# Patient Record
Sex: Male | Born: 1955 | Race: White | Hispanic: No | Smoking: Current every day smoker
Health system: Southern US, Community
[De-identification: ages and names within clinical notes are randomized; demographics above are authoritative.]

## PROBLEM LIST (undated history)

## (undated) DIAGNOSIS — E039 Hypothyroidism, unspecified: Secondary | ICD-10-CM

## (undated) HISTORY — PX: MASS BIOPSY: SHX5445

---

## 1998-10-20 HISTORY — PX: TIBIA FRACTURE SURGERY: SHX806

## 2017-01-28 ENCOUNTER — Other Ambulatory Visit: Payer: Self-pay | Admitting: Family Medicine

## 2017-01-28 DIAGNOSIS — R221 Localized swelling, mass and lump, neck: Secondary | ICD-10-CM

## 2017-02-04 ENCOUNTER — Other Ambulatory Visit: Payer: Self-pay

## 2017-02-06 ENCOUNTER — Ambulatory Visit
Admission: RE | Admit: 2017-02-06 | Discharge: 2017-02-06 | Disposition: A | Discharge status: Home / Self Care | Payer: Self-pay | Source: Ambulatory Visit | Attending: Family Medicine | Admitting: Family Medicine

## 2017-02-06 DIAGNOSIS — R221 Localized swelling, mass and lump, neck: Secondary | ICD-10-CM

## 2017-02-20 ENCOUNTER — Other Ambulatory Visit: Payer: Self-pay | Admitting: Family Medicine

## 2017-02-20 DIAGNOSIS — R221 Localized swelling, mass and lump, neck: Secondary | ICD-10-CM

## 2017-02-27 ENCOUNTER — Ambulatory Visit
Admission: RE | Admit: 2017-02-27 | Discharge: 2017-02-27 | Disposition: A | Discharge status: Home / Self Care | Payer: BLUE CROSS/BLUE SHIELD | Source: Ambulatory Visit | Attending: Family Medicine | Admitting: Family Medicine

## 2017-02-27 DIAGNOSIS — R221 Localized swelling, mass and lump, neck: Secondary | ICD-10-CM

## 2017-02-27 MED ORDER — IOPAMIDOL (ISOVUE-300) INJECTION 61%
75.0000 mL | Freq: Once | INTRAVENOUS | Status: AC | PRN
Start: 2017-02-27 — End: 2017-02-27
  Administered 2017-02-27: 75 mL via INTRAVENOUS

## 2017-03-19 ENCOUNTER — Other Ambulatory Visit (HOSPITAL_COMMUNITY): Payer: Self-pay | Admitting: Otolaryngology

## 2017-03-19 DIAGNOSIS — C770 Secondary and unspecified malignant neoplasm of lymph nodes of head, face and neck: Secondary | ICD-10-CM

## 2017-03-26 ENCOUNTER — Ambulatory Visit (HOSPITAL_COMMUNITY)
Admission: RE | Admit: 2017-03-26 | Discharge: 2017-03-26 | Disposition: A | Discharge status: Home / Self Care | Payer: BLUE CROSS/BLUE SHIELD | Source: Ambulatory Visit | Attending: Otolaryngology | Admitting: Otolaryngology

## 2017-03-26 DIAGNOSIS — C770 Secondary and unspecified malignant neoplasm of lymph nodes of head, face and neck: Secondary | ICD-10-CM | POA: Insufficient documentation

## 2017-03-26 DIAGNOSIS — I700 Atherosclerosis of aorta: Secondary | ICD-10-CM | POA: Diagnosis not present

## 2017-03-26 LAB — GLUCOSE, CAPILLARY: Glucose-Capillary: 92 mg/dL (ref 65–99)

## 2017-03-26 MED ORDER — FLUDEOXYGLUCOSE F - 18 (FDG) INJECTION: 10.1000 | Freq: Once | INTRAVENOUS | Status: DC | PRN

## 2017-03-30 NOTE — H&P (Signed)
HPI:   Eric Lowe is a 61 y.o. male who presents as a consult Patient.   Referring Provider: Harlene Ramus, MD  Chief complaint: Neck mass.  HPI: Found on recent routine office visit with his primary care physician to have a right-sided neck mass. He quit drinking 6 years ago. He smokes about half pack per day. He has had no symptoms related to his throat, swallowing, but. He has not had loss. Did have a CT which revealed possible oropharyngeal mass and the identified neck mass.  PMH/Meds/All/SocHx/FamHx/ROS:   Past Medical History:  Diagnosis Date  . Hyperthyroidism   History reviewed. No pertinent surgical history.  No family history of bleeding disorders, wound healing problems or difficulty with anesthesia.   Social History   Social History  . Marital status: N/A  Spouse name: N/A  . Number of children: N/A  . Years of education: N/A   Occupational History  . Not on file.   Social History Main Topics  . Smoking status: Never Smoker  . Smokeless tobacco: Never Used  . Alcohol use Not on file  . Drug use: Unknown  . Sexual activity: Not on file   Other Topics Concern  . Not on file   Social History Narrative  . No narrative on file   Current Outpatient Prescriptions:  . levothyroxine (SYNTHROID, LEVOTHROID) 150 MCG tablet, , Disp: , Rfl:   A complete ROS was performed with pertinent positives/negatives noted in the HPI. The remainder of the ROS are negative.   Physical Exam:   Ht 1.778 m (5\' 10" )  Wt 93.4 kg (206 lb)  BMI 29.56 kg/m   General: Healthy and alert, in no distress, breathing easily. Normal affect. In a pleasant mood. Head: Normocephalic, atraumatic. No masses, or scars. Eyes: Pupils are equal, and reactive to light. Vision is grossly intact. No spontaneous or gaze nystagmus. Ears: Ear canals are clear. Tympanic membranes are intact, with normal landmarks and the middle ears are clear and healthy. Hearing: Grossly normal. Nose:  Nasal cavities are clear with healthy mucosa, no polyps or exudate.Airways are patent. Face: No masses or scars, facial nerve function is symmetric. Oral Cavity: No mucosal abnormalities are noted. Tongue with normal mobility. Dentition in poor repair. Oropharynx: Tonsils are symmetric. There are no mucosal masses identified. Tongue base appears normal and healthy. Larynx/Hypopharynx: indirect exam reveals healthy, mobile vocal cords, without mucosal lesions in the hypopharynx or larynx. There is no visible mass in the oropharynx or hypopharynx. There is a slight prominence to the right inferior tonsil by palpation. Chest: Deferred Neck: 4 cm right level 2 node, elongated and soft/mobile, no thyroid nodules or enlargement. Neuro: Cranial nerves II-XII will normal function. Balance: Normal gate. Other findings: none.  Independent Review of Additional Tests or Records:  none  Procedures:  Procedure Note:  Indications for procedure: neck mass  Details of the procedure were discussed with the patient and all questions were answered.  Procedure:  2% xylocaine with epinephrine was infiltrated into the overlying skin. First pass was made with a 25 gauge needle and 10 cc syringe. Second pass was made with a 22 gauge needle. Specimen was placed on microscopic slides and air-dried. Additional material was placed in Cytolyte solution for cell block preparation. A third pass was made with a 22 guage needle and sample was added to the cytolyte solution.  A bandage was applied.   He tolerated the procedure well. Results will be discussed when available.  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 Possible tonsillar primary. We discussed the importance of smoking cessation. We will discuss results of the FNA when available. If this is positive, I will recommend a PET scan and then we will consider either referral to [facility] for TORS or chemo/radiation here in town. He will  need to see a dentist as well.

## 2017-04-02 NOTE — Pre-Procedure Instructions (Signed)
Eric Lowe  04/02/2017      [facility] [address], [place] [address] Phone: [phone] Fax: [phone]    Your procedure is scheduled on Monday, April 06, 2017.  Report to [facility] Admitting at 8:30 A.M.   Call this number if you have problems the morning of surgery:  [phone]   Remember:  Do not eat food or drink liquids after midnight.  Take these medicines the morning of surgery with A SIP OF WATER: levothyroxine (Synthroid)  7 days prior to surgery STOP taking any Aspirin, Aleve, Naproxen, Ibuprofen, Motrin, Advil, Goody's, BC's, all herbal medications, fish oil, and all vitamins    Do not wear jewelry, make-up or nail polish.  Do not wear lotions, powders, or perfumes, or deoderant.  Do not shave 48 hours prior to surgery.  Men may shave face and neck.  Do not bring valuables to the hospital.  [facility] is not responsible for any belongings or valuables.  Contacts, dentures or bridgework may not be worn into surgery.  Leave your suitcase in the car.  After surgery it may be brought to your room.  For patients admitted to the hospital, discharge time will be determined by your treatment team.  Patients discharged the day of surgery will not be allowed to drive home.   Name and phone number of your driver:    Special instructions:    [facility]- Preparing For Surgery  Before surgery, you can play an important role. Because skin is not sterile, your skin needs to be as free of germs as possible. You can reduce the number of germs on your skin by washing with CHG (chlorahexidine gluconate) Soap before surgery.  CHG is an antiseptic cleaner which kills germs and bonds with the skin to continue killing germs even after washing.  Please do not use if you have an allergy to CHG or antibacterial soaps. If your skin becomes reddened/irritated stop using the CHG.  Do not shave  (including legs and underarms) for at least 48 hours prior to first CHG shower. It is OK to shave your face.  Please follow these instructions carefully.   1. Shower the NIGHT BEFORE SURGERY and the MORNING OF SURGERY with CHG.   2. If you chose to wash your hair, wash your hair first as usual with your normal shampoo.  3. After you shampoo, rinse your hair and body thoroughly to remove the shampoo.  4. Use CHG as you would any other liquid soap. You can apply CHG directly to the skin and wash gently with a scrungie or a clean washcloth.   5. Apply the CHG Soap to your body ONLY FROM THE NECK DOWN.  Do not use on open wounds or open sores. Avoid contact with your eyes, ears, mouth and genitals (private parts). Wash genitals (private parts) with your normal soap.  6. Wash thoroughly, paying special attention to the area where your surgery will be performed.  7. Thoroughly rinse your body with warm water from the neck down.  8. DO NOT shower/wash with your normal soap after using and rinsing off the CHG Soap.  9. Pat yourself dry with a CLEAN TOWEL.   10. Wear CLEAN PAJAMAS   11. Place CLEAN SHEETS on your bed the night of your first shower and DO NOT SLEEP WITH PETS.    Day of Surgery: Do not apply any deodorants/lotions. Please wear  clean clothes to the hospital/surgery center.      Please read over the following fact sheets that you were given. Pain Booklet and Surgical Site Infection Prevention

## 2017-04-03 ENCOUNTER — Encounter (HOSPITAL_COMMUNITY)
Admission: RE | Admit: 2017-04-03 | Discharge: 2017-04-03 | Disposition: A | Discharge status: Home / Self Care | Payer: BLUE CROSS/BLUE SHIELD | Source: Ambulatory Visit | Attending: Otolaryngology | Admitting: Otolaryngology

## 2017-04-03 ENCOUNTER — Encounter (HOSPITAL_COMMUNITY): Payer: Self-pay

## 2017-04-03 DIAGNOSIS — R221 Localized swelling, mass and lump, neck: Secondary | ICD-10-CM | POA: Diagnosis not present

## 2017-04-03 DIAGNOSIS — Z01812 Encounter for preprocedural laboratory examination: Secondary | ICD-10-CM | POA: Insufficient documentation

## 2017-04-03 HISTORY — DX: Hypothyroidism, unspecified: E03.9

## 2017-04-03 LAB — BASIC METABOLIC PANEL
Anion gap: 9 (ref 5–15)
BUN: 9 mg/dL (ref 6–20)
CALCIUM: 9.3 mg/dL (ref 8.9–10.3)
CO2: 26 mmol/L (ref 22–32)
Chloride: 105 mmol/L (ref 101–111)
Creatinine, Ser: 0.86 mg/dL (ref 0.61–1.24)
GFR calc non Af Amer: >60 mL/min (ref >60)
Glucose, Bld: 98 mg/dL (ref 65–99)
Potassium: 4.2 mmol/L (ref 3.5–5.1)
SODIUM: 140 mmol/L (ref 135–145)

## 2017-04-03 LAB — CBC
HEMATOCRIT: 45.8 % (ref 39.0–52.0)
Hemoglobin: 15.5 g/dL (ref 13.0–17.0)
MCH: 29.2 pg (ref 26.0–34.0)
MCHC: 33.8 g/dL (ref 30.0–36.0)
MCV: 86.4 fL (ref 78.0–100.0)
Platelets: 276 10*3/uL (ref 150–400)
RBC: 5.3 MIL/uL (ref 4.22–5.81)
RDW: 13.6 % (ref 11.5–15.5)
WBC: 9.8 10*3/uL (ref 4.0–10.5)

## 2017-04-06 ENCOUNTER — Observation Stay (HOSPITAL_COMMUNITY)
Admission: RE | Admit: 2017-04-06 | Discharge: 2017-04-07 | Disposition: A | Discharge status: Home / Self Care | Payer: BLUE CROSS/BLUE SHIELD | Source: Ambulatory Visit | Attending: Otolaryngology | Admitting: Otolaryngology

## 2017-04-06 ENCOUNTER — Inpatient Hospital Stay (HOSPITAL_COMMUNITY): Payer: BLUE CROSS/BLUE SHIELD | Admitting: Certified Registered Nurse Anesthetist

## 2017-04-06 ENCOUNTER — Encounter (HOSPITAL_COMMUNITY)
Admission: RE | Disposition: A | Discharge status: Home / Self Care | Payer: Self-pay | Source: Ambulatory Visit | Attending: Otolaryngology

## 2017-04-06 ENCOUNTER — Encounter (HOSPITAL_COMMUNITY): Payer: Self-pay | Admitting: General Practice

## 2017-04-06 DIAGNOSIS — D119 Benign neoplasm of major salivary gland, unspecified: Secondary | ICD-10-CM | POA: Diagnosis not present

## 2017-04-06 DIAGNOSIS — R221 Localized swelling, mass and lump, neck: Secondary | ICD-10-CM | POA: Diagnosis present

## 2017-04-06 DIAGNOSIS — F17210 Nicotine dependence, cigarettes, uncomplicated: Secondary | ICD-10-CM | POA: Diagnosis not present

## 2017-04-06 DIAGNOSIS — E039 Hypothyroidism, unspecified: Secondary | ICD-10-CM | POA: Insufficient documentation

## 2017-04-06 DIAGNOSIS — Z79899 Other long term (current) drug therapy: Secondary | ICD-10-CM | POA: Insufficient documentation

## 2017-04-06 HISTORY — PX: RADICAL NECK DISSECTION: SHX2284

## 2017-04-06 SURGERY — DISSECTION, NECK, RADICAL
Anesthesia: General | Site: Neck | Laterality: Right

## 2017-04-06 MED ORDER — BACITRACIN ZINC 500 UNIT/GM EX OINT
TOPICAL_OINTMENT | CUTANEOUS | Status: AC
  Filled 2017-04-06: qty 28.35

## 2017-04-06 MED ORDER — SUGAMMADEX SODIUM 200 MG/2ML IV SOLN
INTRAVENOUS | Status: DC | PRN
  Administered 2017-04-06: 180 mg via INTRAVENOUS

## 2017-04-06 MED ORDER — ROCURONIUM BROMIDE 10 MG/ML (PF) SYRINGE
PREFILLED_SYRINGE | INTRAVENOUS | Status: DC | PRN
  Administered 2017-04-06: 20 mg via INTRAVENOUS
  Administered 2017-04-06: 50 mg via INTRAVENOUS

## 2017-04-06 MED ORDER — HYDROCODONE-ACETAMINOPHEN 5-325 MG PO TABS
1.0000 | ORAL_TABLET | ORAL | Status: DC | PRN
  Administered 2017-04-06 (×2): 2 via ORAL
  Filled 2017-04-06 (×2): qty 2

## 2017-04-06 MED ORDER — LACTATED RINGERS IV SOLN
INTRAVENOUS | Status: DC
  Administered 2017-04-06 (×2): via INTRAVENOUS

## 2017-04-06 MED ORDER — PROPOFOL 10 MG/ML IV BOLUS
INTRAVENOUS | Status: DC | PRN
  Administered 2017-04-06: 180 mg via INTRAVENOUS

## 2017-04-06 MED ORDER — HYDROCODONE-ACETAMINOPHEN 7.5-325 MG PO TABS: 1.0000 | ORAL_TABLET | Freq: Four times a day (QID) | ORAL | 0 refills | Status: AC | PRN

## 2017-04-06 MED ORDER — KETOROLAC TROMETHAMINE 30 MG/ML IJ SOLN
INTRAMUSCULAR | Status: AC
  Filled 2017-04-06: qty 1

## 2017-04-06 MED ORDER — MIDAZOLAM HCL 2 MG/2ML IJ SOLN
INTRAMUSCULAR | Status: DC | PRN
  Administered 2017-04-06: 2 mg via INTRAVENOUS

## 2017-04-06 MED ORDER — PROPOFOL 10 MG/ML IV BOLUS
INTRAVENOUS | Status: AC
  Filled 2017-04-06: qty 20

## 2017-04-06 MED ORDER — LIDOCAINE 2% (20 MG/ML) 5 ML SYRINGE
INTRAMUSCULAR | Status: DC | PRN
  Administered 2017-04-06: 100 mg via INTRAVENOUS

## 2017-04-06 MED ORDER — PROMETHAZINE HCL 25 MG RE SUPP: 25.0000 mg | Freq: Four times a day (QID) | RECTAL | Status: DC | PRN

## 2017-04-06 MED ORDER — KETOROLAC TROMETHAMINE 30 MG/ML IJ SOLN
INTRAMUSCULAR | Status: DC | PRN
  Administered 2017-04-06: 30 mg via INTRAVENOUS

## 2017-04-06 MED ORDER — ALBUTEROL SULFATE HFA 108 (90 BASE) MCG/ACT IN AERS
INHALATION_SPRAY | RESPIRATORY_TRACT | Status: DC | PRN
  Administered 2017-04-06: 6 via RESPIRATORY_TRACT

## 2017-04-06 MED ORDER — PROMETHAZINE HCL 25 MG RE SUPP: 25.0000 mg | Freq: Four times a day (QID) | RECTAL | 1 refills | Status: AC | PRN

## 2017-04-06 MED ORDER — FENTANYL CITRATE (PF) 250 MCG/5ML IJ SOLN
INTRAMUSCULAR | Status: DC | PRN
  Administered 2017-04-06: 150 ug via INTRAVENOUS
  Administered 2017-04-06: 50 ug via INTRAVENOUS

## 2017-04-06 MED ORDER — FENTANYL CITRATE (PF) 100 MCG/2ML IJ SOLN
INTRAMUSCULAR | Status: AC
  Filled 2017-04-06: qty 2

## 2017-04-06 MED ORDER — DEXTROSE-NACL 5-0.9 % IV SOLN
INTRAVENOUS | Status: DC
  Administered 2017-04-06 – 2017-04-07 (×2): via INTRAVENOUS

## 2017-04-06 MED ORDER — PHENYLEPHRINE HCL 10 MG/ML IJ SOLN
INTRAVENOUS | Status: DC | PRN
  Administered 2017-04-06: 20 ug/min via INTRAVENOUS

## 2017-04-06 MED ORDER — LIDOCAINE-EPINEPHRINE 1 %-1:100000 IJ SOLN
INTRAMUSCULAR | Status: AC
  Filled 2017-04-06: qty 1

## 2017-04-06 MED ORDER — SUCCINYLCHOLINE CHLORIDE 20 MG/ML IJ SOLN
INTRAMUSCULAR | Status: DC | PRN
Start: 2017-04-06 — End: 2017-04-06
  Administered 2017-04-06: 120 mg via INTRAVENOUS

## 2017-04-06 MED ORDER — LEVOTHYROXINE SODIUM 75 MCG PO TABS
150.0000 ug | ORAL_TABLET | Freq: Every day | ORAL | Status: DC
  Administered 2017-04-07: 150 ug via ORAL
  Filled 2017-04-06: qty 2

## 2017-04-06 MED ORDER — ONDANSETRON HCL 4 MG/2ML IJ SOLN
INTRAMUSCULAR | Status: DC | PRN
  Administered 2017-04-06: 4 mg via INTRAVENOUS

## 2017-04-06 MED ORDER — 0.9 % SODIUM CHLORIDE (POUR BTL) OPTIME
TOPICAL | Status: DC | PRN
  Administered 2017-04-06: 1000 mL

## 2017-04-06 MED ORDER — FENTANYL CITRATE (PF) 250 MCG/5ML IJ SOLN
INTRAMUSCULAR | Status: AC
  Filled 2017-04-06: qty 5

## 2017-04-06 MED ORDER — PROMETHAZINE HCL 25 MG PO TABS: 25.0000 mg | ORAL_TABLET | Freq: Four times a day (QID) | ORAL | Status: DC | PRN

## 2017-04-06 MED ORDER — IBUPROFEN 100 MG/5ML PO SUSP
400.0000 mg | Freq: Four times a day (QID) | ORAL | Status: DC | PRN
  Filled 2017-04-06: qty 20

## 2017-04-06 MED ORDER — LIDOCAINE 2% (20 MG/ML) 5 ML SYRINGE
INTRAMUSCULAR | Status: AC
  Filled 2017-04-06: qty 5

## 2017-04-06 MED ORDER — SUCCINYLCHOLINE CHLORIDE 200 MG/10ML IV SOSY
PREFILLED_SYRINGE | INTRAVENOUS | Status: AC
  Filled 2017-04-06: qty 10

## 2017-04-06 MED ORDER — FENTANYL CITRATE (PF) 100 MCG/2ML IJ SOLN
25.0000 ug | INTRAMUSCULAR | Status: DC | PRN
  Administered 2017-04-06: 50 ug via INTRAVENOUS

## 2017-04-06 MED ORDER — MIDAZOLAM HCL 2 MG/2ML IJ SOLN
INTRAMUSCULAR | Status: AC
  Filled 2017-04-06: qty 2

## 2017-04-06 SURGICAL SUPPLY — 37 items
BLADE 15 SAFETY STRL DISP (BLADE) ×2 IMPLANT
CANISTER SUCT 3000ML PPV (MISCELLANEOUS) ×2 IMPLANT
CLEANER TIP ELECTROSURG 2X2 (MISCELLANEOUS) ×2 IMPLANT
CONT SPEC 4OZ CLIKSEAL STRL BL (MISCELLANEOUS) ×2 IMPLANT
CORDS BIPOLAR (ELECTRODE) ×2 IMPLANT
COVER SURGICAL LIGHT HANDLE (MISCELLANEOUS) ×2 IMPLANT
DERMABOND ADVANCED (GAUZE/BANDAGES/DRESSINGS) ×1
DERMABOND ADVANCED .7 DNX12 (GAUZE/BANDAGES/DRESSINGS) ×1 IMPLANT
DRAIN HEMOVAC 7FR (DRAIN) ×2 IMPLANT
ELECT COATED BLADE 2.86 ST (ELECTRODE) ×2 IMPLANT
ELECT REM PT RETURN 9FT ADLT (ELECTROSURGICAL) ×2
ELECTRODE REM PT RTRN 9FT ADLT (ELECTROSURGICAL) ×1 IMPLANT
EVACUATOR SILICONE 100CC (DRAIN) ×2 IMPLANT
FORCEPS BIPOLAR SPETZLER 8 1.0 (NEUROSURGERY SUPPLIES) ×2 IMPLANT
GAUZE SPONGE 4X4 16PLY XRAY LF (GAUZE/BANDAGES/DRESSINGS) ×2 IMPLANT
GLOVE ECLIPSE 7.5 STRL STRAW (GLOVE) ×2 IMPLANT
GOWN STRL REUS W/ TWL LRG LVL3 (GOWN DISPOSABLE) ×4 IMPLANT
GOWN STRL REUS W/TWL LRG LVL3 (GOWN DISPOSABLE) ×4
KIT BASIN OR (CUSTOM PROCEDURE TRAY) ×2 IMPLANT
KIT ROOM TURNOVER OR (KITS) ×2 IMPLANT
NEEDLE PRECISIONGLIDE 27X1.5 (NEEDLE) ×2 IMPLANT
NS IRRIG 1000ML POUR BTL (IV SOLUTION) ×2 IMPLANT
PAD ARMBOARD 7.5X6 YLW CONV (MISCELLANEOUS) ×4 IMPLANT
PENCIL FOOT CONTROL (ELECTRODE) ×2 IMPLANT
SHEARS HARMONIC 9CM CVD (BLADE) ×2 IMPLANT
SPONGE INTESTINAL PEANUT (DISPOSABLE) ×2 IMPLANT
STAPLER VISISTAT 35W (STAPLE) ×2 IMPLANT
SUT CHROMIC 3 0 SH 27 (SUTURE) ×2 IMPLANT
SUT CHROMIC 5 0 P 3 (SUTURE) IMPLANT
SUT ETHILON 3 0 PS 1 (SUTURE) ×2 IMPLANT
SUT ETHILON 5 0 PS 2 18 (SUTURE) IMPLANT
SUT SILK 2 0 REEL (SUTURE) IMPLANT
SUT SILK 3 0 REEL (SUTURE) ×2 IMPLANT
SUT SILK 3 0 SH CR/8 (SUTURE) ×2 IMPLANT
SUT SILK 4 0 REEL (SUTURE) ×2 IMPLANT
SUT VIC AB 3-0 SH 18 (SUTURE) IMPLANT
TRAY ENT MC OR (CUSTOM PROCEDURE TRAY) ×2 IMPLANT

## 2017-04-06 NOTE — Anesthesia Procedure Notes (Signed)
Procedure Name: Intubation Date/Time: 04/06/2017 11:55 AM Performed by: Julieta Bellini Pre-anesthesia Checklist: Patient identified, Emergency Drugs available, Suction available and Patient being monitored Patient Re-evaluated:Patient Re-evaluated prior to inductionOxygen Delivery Method: Circle system utilized Preoxygenation: Pre-oxygenation with 100% oxygen Intubation Type: IV induction Ventilation: Mask ventilation without difficulty Laryngoscope Size: Mac and 4 Grade View: Grade I Tube type: Oral Tube size: 7.5 mm Number of attempts: 1 Airway Equipment and Method: Stylet Placement Confirmation: ETT inserted through vocal cords under direct vision,  positive ETCO2 and breath sounds checked- equal and bilateral Secured at: 24 cm Tube secured with: Tape Dental Injury: Teeth and Oropharynx as per pre-operative assessment

## 2017-04-06 NOTE — Progress Notes (Signed)
Day of Surgery   Subjective/Chief Complaint: No problem or complaint.   Objective: Vital signs in last 24 hours: Temp:  [97.4 F (36.3 C)-98.4 F (36.9 C)] 98.2 F (36.8 C) (06/18 1535) Pulse Rate:  [66-76] 74 (06/18 1535) Resp:  [14-20] 20 (06/18 1535) BP: (125-144)/(82-87) 142/85 (06/18 1535) SpO2:  [91 %-98 %] 97 % (06/18 1535) Weight:  [91.2 kg (201 lb)] 91.2 kg (201 lb) (06/18 0920)    Intake/Output from previous day: No intake/output data recorded. Intake/Output this shift: No intake/output data recorded.  wound ok and no hematoma.   Lab Results:  No results for input(s): WBC, HGB, HCT, PLT in the last 72 hours. BMET No results for input(s): NA, K, CL, CO2, GLUCOSE, BUN, CREATININE, CALCIUM in the last 72 hours. PT/INR No results for input(s): LABPROT, INR in the last 72 hours. ABG No results for input(s): PHART, HCO3 in the last 72 hours.  Invalid input(s): PCO2, PO2  Studies/Results: No results found.  Anti-infectives: Anti-infectives    None      Assessment/Plan: s/p Procedure(s) with comments: RADICAL NECK DISSECTION (Right) - Particial right neck dissection, frozen section he is doing well and continue care.  LOS: 1 day    Melissa Montane 04/06/2017

## 2017-04-06 NOTE — Interval H&P Note (Signed)
History and Physical Interval Note:  04/06/2017 10:28 AM  Eric Lowe  has presented today for surgery, with the diagnosis of Right neck mass  The various methods of treatment have been discussed with the patient and family. After consideration of risks, benefits and other options for treatment, the patient has consented to  Procedure(s) with comments: RADICAL NECK DISSECTION (Right) - Particial right neck dissection, frozen section, possible complete modified radical neck dissection. Possible direct laryngoscopy, possible esophagoscopy as a surgical intervention .  The patient's history has been reviewed, patient examined, no change in status, stable for surgery.  I have reviewed the patient's chart and labs.  Questions were answered to the patient's satisfaction.     Marcelina Mclaurin

## 2017-04-06 NOTE — Discharge Instructions (Signed)
You may shower and use soap and water. Do not use any creams, oils or ointment. ° °

## 2017-04-06 NOTE — Op Note (Signed)
OPERATIVE REPORT  DATE OF SURGERY: 04/06/2017  PATIENT:  Resa Miner,  61 y.o. male  PRE-OPERATIVE DIAGNOSIS:  Right neck mass  POST-OPERATIVE DIAGNOSIS:  Right neck mass  PROCEDURE:  Procedure(s): RADICAL NECK DISSECTION  SURGEON:  Beckie Salts, MD  ASSISTANTS: Judyann Munson, RNFA  ANESTHESIA:   General   EBL:  10 ml  DRAINS: 7 French round J-P  LOCAL MEDICATIONS USED:  None  SPECIMEN:  Right neck mass, frozen section consistent with Warthin's tumor  COUNTS:  Correct  PROCEDURE DETAILS: The patient was taken to the operating room and placed on the operating table in the supine position. Following induction of general endotracheal anesthesia, the right neck was prepped and draped in a standard fashion. An incision was outlined with a marking pen starting the mastoid tip working down towards a lower cervical skin crease in preparation for potential full neck dissection. The small portion of the incision was used to approximate 6 cm in length overlying the area where the tumor was located. Left cautery was used to incise the skin and subcutaneous tissue and through the platysma. Subcutaneous platysmal flap was elevated superiorly. The greater auricular nerve was identified and preserved. Self-retaining retractor was used. The greater regular nerve was dissected out of surrounding fascia. The fascia was then dissected anteriorly off the sternocleidal mastoid muscle. The mass was identified and was completely resected. The large external jugular vein was ligated between clamps and divided. 4-0 silk ties were used. The mass appeared to be adherent to the lower tip of the parotid gland. A thin cuff of parotid tissue was taken with the specimen. The facial nerve was felt to be more anterior and superior to the level of dissection and was not identified. The entire mass was removed and sent for pathologic evaluation using frozen section. Following the results of the frozen section  the wound was irrigated with saline and closed in layers. Interrupted 3-0 chromic was used on the platysma layer and the running subcutaneous closure. Dermabond was used on the skin. The drain was exited through separate stab incision secured in place with nylon suture. Patient was awakened extubated and transferred to recovery in stable condition.    PATIENT DISPOSITION:  To PACU, stable

## 2017-04-06 NOTE — Anesthesia Preprocedure Evaluation (Signed)
Anesthesia Evaluation  Patient identified by MRN, date of birth, ID band Patient awake    Reviewed: Allergy & Precautions, NPO status , Patient's Chart, lab work & pertinent test results  Airway Mallampati: II  TM Distance: >3 FB     Dental   Pulmonary neg pulmonary ROS, Current Smoker,    breath sounds clear to auscultation       Cardiovascular negative cardio ROS   Rhythm:Regular Rate:Normal     Neuro/Psych    GI/Hepatic negative GI ROS, Neg liver ROS,   Endo/Other  Hypothyroidism   Renal/GU negative Renal ROS     Musculoskeletal   Abdominal   Peds  Hematology   Anesthesia Other Findings   Reproductive/Obstetrics                             Anesthesia Physical Anesthesia Plan  ASA: III  Anesthesia Plan: General   Post-op Pain Management:    Induction: Intravenous  PONV Risk Score and Plan: 2 and Ondansetron, Propofol and Midazolam  Airway Management Planned: Oral ETT  Additional Equipment:   Intra-op Plan:   Post-operative Plan: Extubation in OR  Informed Consent: I have reviewed the patients History and Physical, chart, labs and discussed the procedure including the risks, benefits and alternatives for the proposed anesthesia with the patient or authorized representative who has indicated his/her understanding and acceptance.   Dental advisory given  Plan Discussed with: CRNA, Anesthesiologist and Surgeon  Anesthesia Plan Comments:         Anesthesia Quick Evaluation

## 2017-04-06 NOTE — Addendum Note (Signed)
Addendum  created 04/06/17 1622 by Julieta Bellini, CRNA   Anesthesia Intra Meds edited

## 2017-04-06 NOTE — Anesthesia Postprocedure Evaluation (Signed)
Anesthesia Post Note  Patient: Eric Lowe  Procedure(s) Performed: Procedure(s) (LRB): RADICAL NECK DISSECTION (Right)     Patient location during evaluation: PACU Anesthesia Type: General Level of consciousness: awake Pain management: pain level controlled Vital Signs Assessment: post-procedure vital signs reviewed and stable Respiratory status: spontaneous breathing Cardiovascular status: stable Anesthetic complications: no    Last Vitals:  Vitals:   04/06/17 1410 04/06/17 1415  BP: 127/82   Pulse: 72 76  Resp: 16 14  Temp:      Last Pain:  Vitals:   04/06/17 1401  TempSrc:   PainSc: 6                  [name]

## 2017-04-06 NOTE — Transfer of Care (Signed)
Immediate Anesthesia Transfer of Care Note  Patient: Eric Lowe  Procedure(s) Performed: Procedure(s) with comments: RADICAL NECK DISSECTION (Right) - Particial right neck dissection, frozen section  Patient Location: PACU  Anesthesia Type:General  Level of Consciousness: awake, alert , oriented and patient cooperative  Airway & Oxygen Therapy: Patient Spontanous Breathing and Patient connected to nasal cannula oxygen  Post-op Assessment: Report given to RN, Post -op Vital signs reviewed and stable and Patient moving all extremities X 4  Post vital signs: Reviewed and stable  Last Vitals:  Vitals:   04/06/17 0920  BP: 137/85  Pulse: 72  Resp: 18  Temp: 36.9 C    Last Pain:  Vitals:   04/06/17 1000  TempSrc:   PainSc: 0-No pain      Patients Stated Pain Goal: 3 (38/88/28 0034)  Complications: No apparent anesthesia complications

## 2017-04-06 NOTE — Anesthesia Postprocedure Evaluation (Signed)
Anesthesia Post Note  Patient: Eric Lowe  Procedure(s) Performed: Procedure(s) (LRB): RADICAL NECK DISSECTION (Right)     Patient location during evaluation: PACU Anesthesia Type: General Level of consciousness: awake Pain management: pain level controlled Vital Signs Assessment: post-procedure vital signs reviewed and stable Respiratory status: spontaneous breathing Cardiovascular status: stable Anesthetic complications: no    Last Vitals:  Vitals:   04/06/17 1341 04/06/17 1345  BP: 137/87   Pulse: 73 73  Resp: 17 17  Temp:      Last Pain:  Vitals:   04/06/17 1000  TempSrc:   PainSc: 0-No pain                 France Noyce

## 2017-04-07 ENCOUNTER — Encounter (HOSPITAL_COMMUNITY): Payer: Self-pay | Admitting: Otolaryngology

## 2017-04-07 DIAGNOSIS — D119 Benign neoplasm of major salivary gland, unspecified: Secondary | ICD-10-CM | POA: Diagnosis not present

## 2017-04-07 NOTE — Discharge Summary (Signed)
  Physician Discharge Summary  Patient ID: Eric Lowe MRN: [id number] DOB/AGE: 60-Aug-1957 60 y.o.  Admit date: 04/06/2017 Discharge date: 04/07/2017  Admission Diagnoses:neck mass  Discharge Diagnoses:  Active Problems:   Warthin's tumor   Discharged Condition: good  Hospital Course: no complications  Consults: none  Significant Diagnostic Studies: none  Treatments: surgery: excision of neck mass  Discharge Exam: Blood pressure (!) 147/84, pulse 66, temperature 98.5 F (36.9 C), temperature source Oral, resp. rate 19, weight 91.2 kg (201 lb), SpO2 99 %. PHYSICAL EXAM: Incision looks excellent. No swelling. Facial nerve completely intact. JP drain removed.  Disposition: Final discharge disposition not confirmed  Discharge Instructions    Diet - low sodium heart healthy    Complete by:  As directed    Increase activity slowly    Complete by:  As directed      Allergies as of 04/07/2017      Reactions   No Known Allergies       Medication List    TAKE these medications   HYDROcodone-acetaminophen 7.5-325 MG tablet Commonly known as:  NORCO Take 1 tablet by mouth every 6 (six) hours as needed for moderate pain.   levothyroxine 150 MCG tablet Commonly known as:  SYNTHROID, LEVOTHROID Take 150 mcg by mouth daily.   promethazine 25 MG suppository Commonly known as:  PHENERGAN Place 1 suppository (25 mg total) rectally every 6 (six) hours as needed for nausea or vomiting.      Follow-up Information    Izora Gala, MD. Schedule an appointment as soon as possible for a visit in 1 week.   Specialty:  Otolaryngology Contact information: [address] [place] [address] [phone]           Signed: Izora Gala 04/07/2017, 8:47 AM

## 2018-03-09 IMAGING — PT NM PET TUM IMG INITIAL (PI) SKULL BASE T - THIGH
7 series · 25 of 25 positions shown · non-contrast
Comparison: Neck CT 02/27/2017

CLINICAL DATA: Initial treatment strategy for head neck carcinoma..
RIGHT cervical lymphadenopathy

EXAM:
NUCLEAR MEDICINE PET SKULL BASE TO THIGH
TECHNIQUE: 10.1 MCi F-18 FDG was injected intravenously. Full-ring PET imaging
was performed from the skull base to thigh after the radiotracer. CT
data was obtained and used for attenuation correction and anatomic
localization.
FASTING BLOOD GLUCOSE:  Value: 94 mg/dl

[Series 3: pet hn_sk_thigh ac · axial · 5.0mm · 4.07mm/px · z∈[-1031,-91]mm · 5 of 236 slices shown]
[im 1/236]
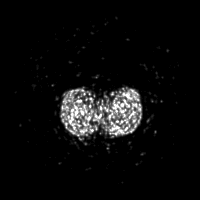
[im 59/236]
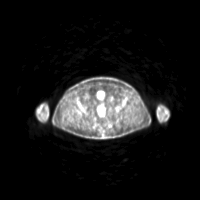
[im 118/236]
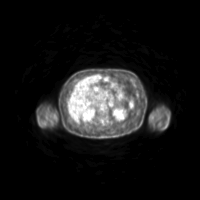
[im 177/236]
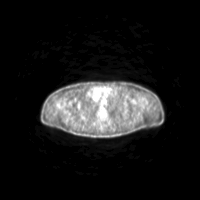
[im 236/236]
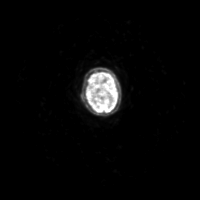

[Series 4: ct hn_sk_th 5.0 b31f · axial · 5.0mm · 0.98mm/px · z∈[-1031,-91]mm · 5 of 236 slices shown]
[im 1/236]
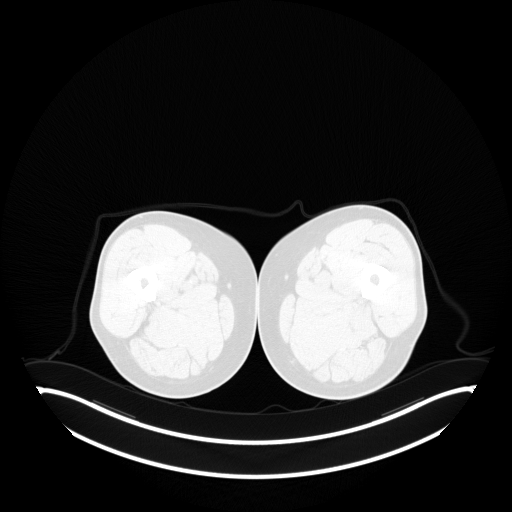
[im 59/236]
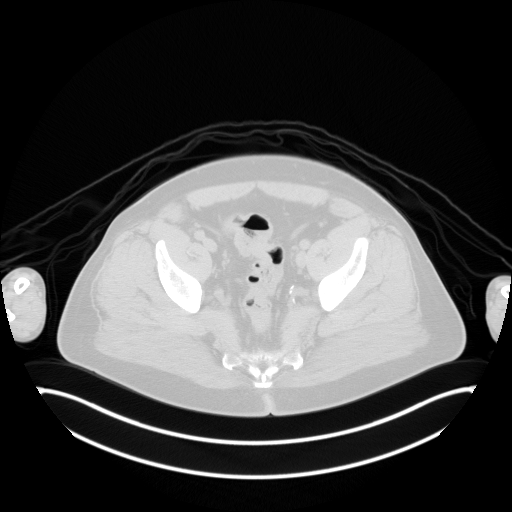
[im 118/236]
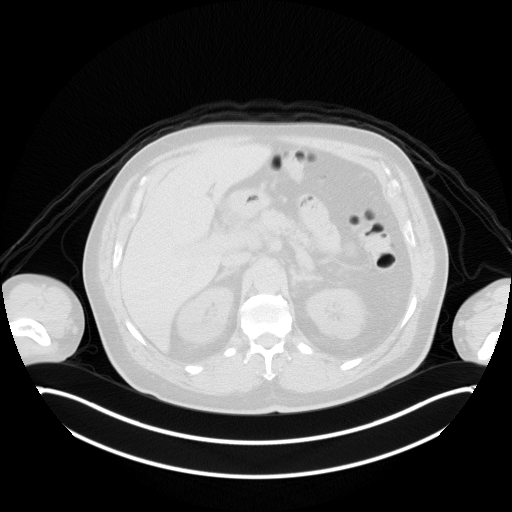
[im 177/236]
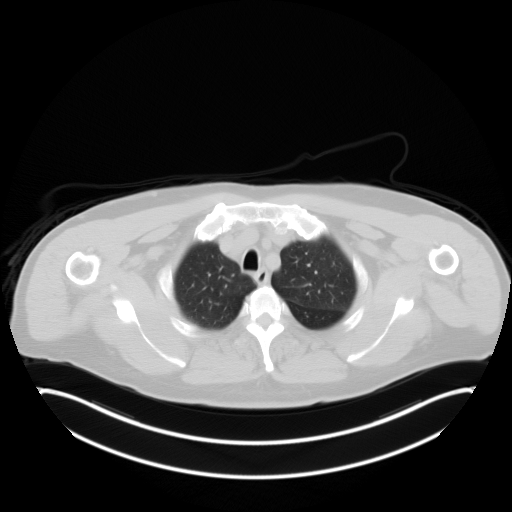
[im 236/236  brain]
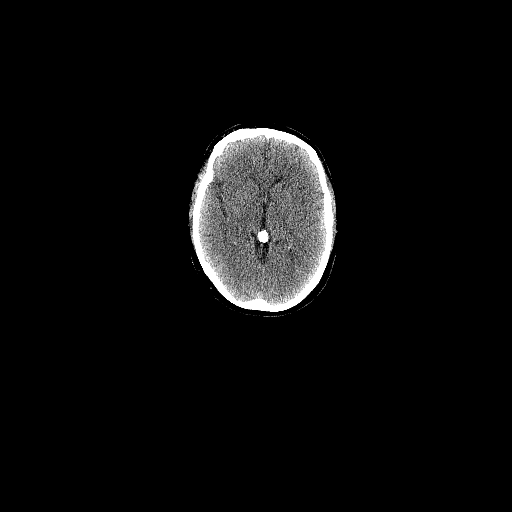

[Series 7: ct hn_sk_th 5.0 b70f (id)_bone · axial · 5.0mm · 0.76mm/px · z∈[-531,-263]mm · 2 of 68 slices shown]
[im 1/68  bone]
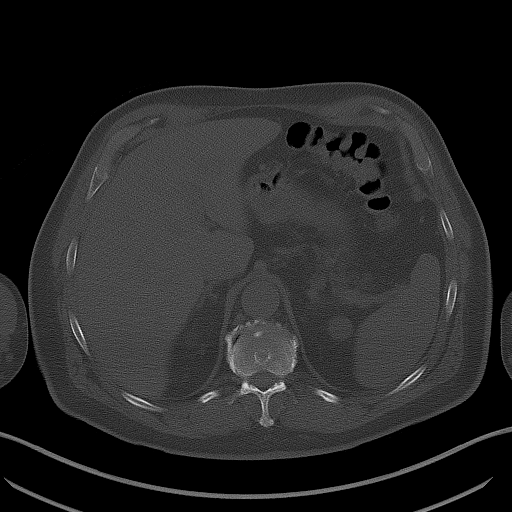
[im 68/68  bone]
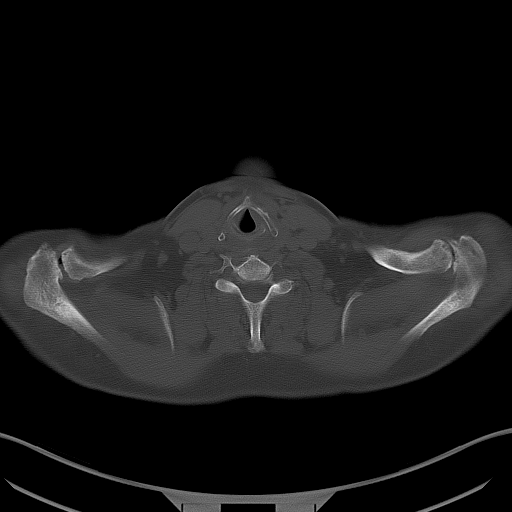

[Series 8: pet hn_sk_thigh nac · axial · 5.0mm · 4.07mm/px · z∈[-1031,-91]mm · 5 of 236 slices shown]
[im 1/236]
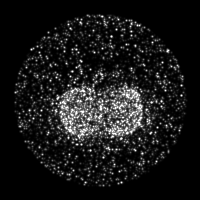
[im 59/236]
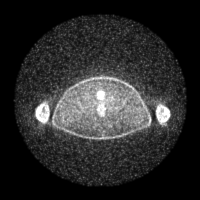
[im 118/236]
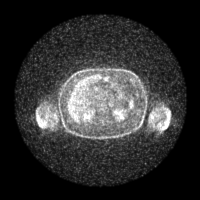
[im 177/236]
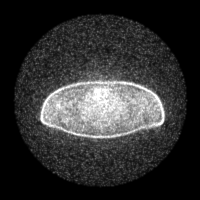
[im 236/236]
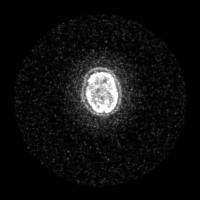

[Series 604: mip collection · coronal · 1.95mm/px · 1 of 32 slices shown]
[im 1/32]
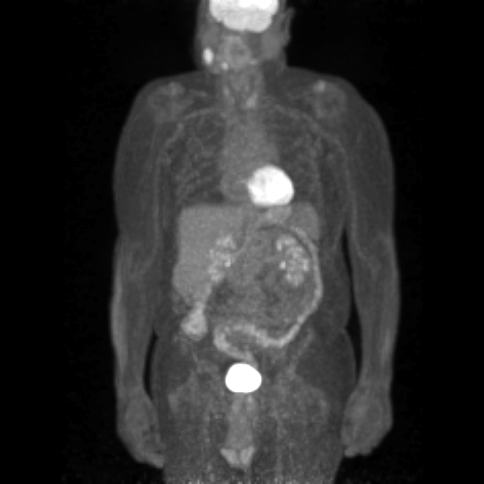

[Series 606: range-ct hn_sk_th 5.0 (id)<alpha range(1)> · 2 of 69 slices shown]
[im 1/69]
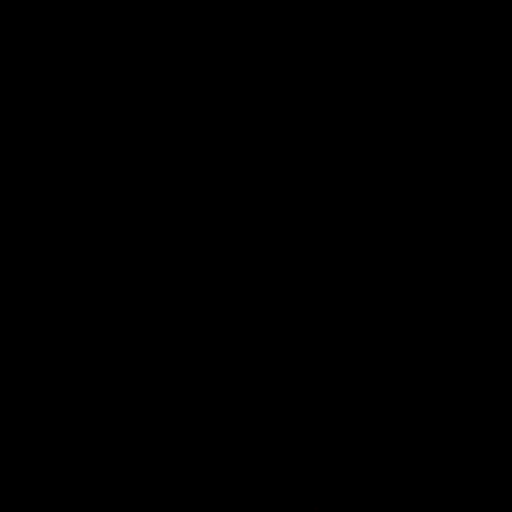
[im 69/69]
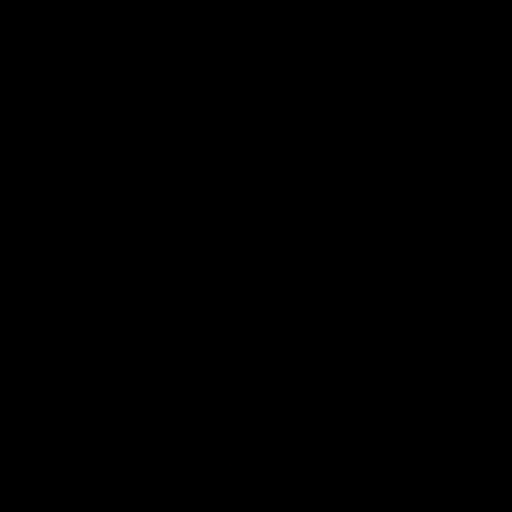

[Series 607: range-ct hn_sk_th 5.0 (id)<alpha range> · 5 of 223 slices shown]
[im 1/223]
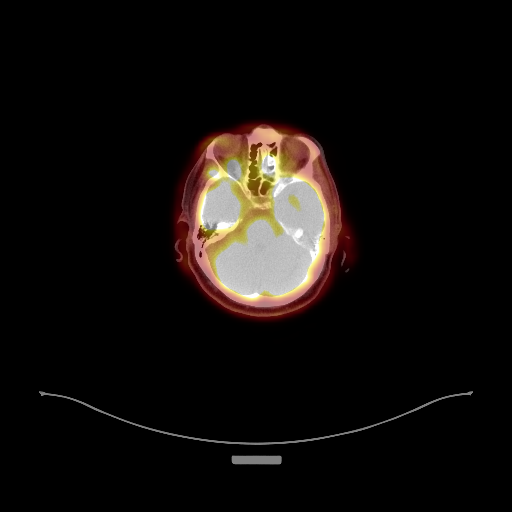
[im 56/223]
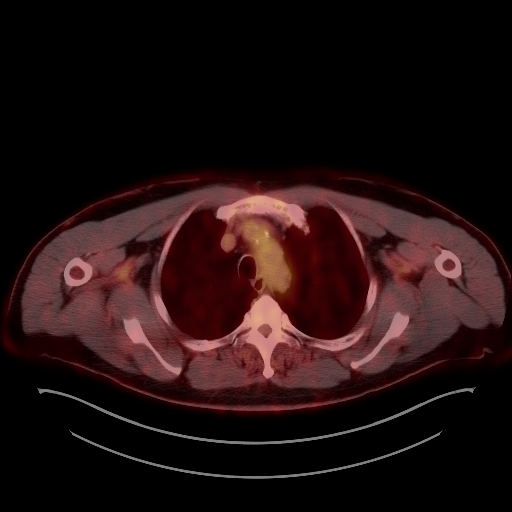
[im 112/223]
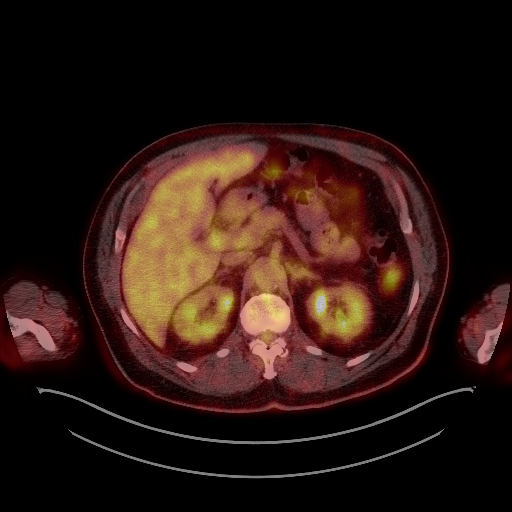
[im 167/223]
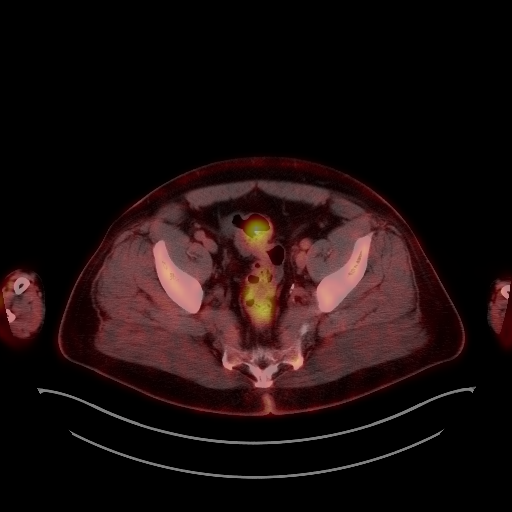
[im 223/223]
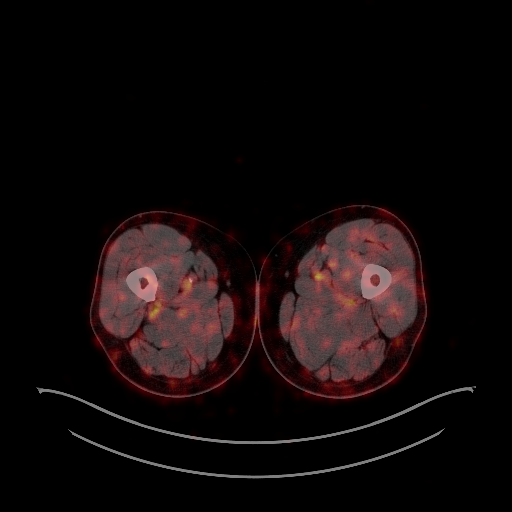

[25 of 25 positions shown; findings below may reference images not displayed]

FINDINGS: NECK

Enlarged hypermetabolic RIGHT level 2 lymph node superficial to the
sternocleidomastoid muscle measures 20 mm short axis with SUV max
equal 10.3.

No additional hypermetabolic RIGHT cervical lymph nodes. No
hypermetabolic LEFT cervical lymph nodes.

There is uniform moderate metabolic activity along the base of the
tongue and hypopharynx (image 30 of the fused data set). No
focality.

Single focus of intense activity within the anterior aspect of the
RIGHT mandible is favored. Genetic (image 32, fused data set).

CHEST

No hypermetabolic mediastinal or supraclavicular lymph nodes. No
suspicious pulmonary nodules. Minimal coronary calcification.

ABDOMEN/PELVIS

No abnormal hypermetabolic activity within the liver, pancreas,
adrenal glands, or spleen. No hypermetabolic lymph nodes in the
abdomen or pelvis.

SKELETON

No focal hypermetabolic activity to suggest skeletal metastasis.
IMPRESSION: 1. Hypermetabolic RIGHT level II lymph node consistent with
metastatic adenopathy.
2. No clear primary lesion identified. Uniform uptake along the base
of tongue.
3. Focal intense uptake along the anterior RIGHT mandible is favored
odontogenic in etiology.
4. No additional hypermetabolic cervical lymph nodes or distant
metastasis.
5.  Atherosclerotic calcification of the aorta.
